# Patient Record
Sex: Female | Born: 2000 | Race: White | Hispanic: No | Marital: Single | Smoking: Never smoker
Health system: Southern US, Community
[De-identification: ages and names within clinical notes are randomized; demographics above are authoritative.]

## PROBLEM LIST (undated history)

## (undated) DIAGNOSIS — R519 Headache, unspecified: Secondary | ICD-10-CM

## (undated) DIAGNOSIS — R51 Headache: Secondary | ICD-10-CM

## (undated) DIAGNOSIS — F909 Attention-deficit hyperactivity disorder, unspecified type: Secondary | ICD-10-CM

## (undated) DIAGNOSIS — R569 Unspecified convulsions: Secondary | ICD-10-CM

## (undated) DIAGNOSIS — E611 Iron deficiency: Secondary | ICD-10-CM

## (undated) HISTORY — DX: Headache, unspecified: R51.9

## (undated) HISTORY — DX: Unspecified convulsions: R56.9

## (undated) HISTORY — DX: Attention-deficit hyperactivity disorder, unspecified type: F90.9

## (undated) HISTORY — PX: NO PAST SURGERIES: SHX2092

## (undated) HISTORY — DX: Headache: R51

---
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
## 2020-03-20 ENCOUNTER — Other Ambulatory Visit: Payer: Self-pay

## 2020-03-20 ENCOUNTER — Telehealth (INDEPENDENT_AMBULATORY_CARE_PROVIDER_SITE_OTHER): Payer: Medicaid Other | Admitting: Psychiatry

## 2020-03-20 DIAGNOSIS — F329 Major depressive disorder, single episode, unspecified: Secondary | ICD-10-CM | POA: Diagnosis not present

## 2020-03-20 DIAGNOSIS — F410 Panic disorder [episodic paroxysmal anxiety] without agoraphobia: Secondary | ICD-10-CM | POA: Insufficient documentation

## 2020-03-20 DIAGNOSIS — F900 Attention-deficit hyperactivity disorder, predominantly inattentive type: Secondary | ICD-10-CM

## 2020-03-20 DIAGNOSIS — F32A Depression, unspecified: Secondary | ICD-10-CM | POA: Insufficient documentation

## 2020-03-20 MED ORDER — FLUOXETINE HCL 10 MG PO CAPS: ORAL_CAPSULE | ORAL | 0 refills | Status: DC

## 2020-03-20 MED ORDER — AMPHETAMINE-DEXTROAMPHET ER 15 MG PO CP24
15.0000 mg | ORAL_CAPSULE | ORAL | 0 refills | Status: DC
Start: 2020-03-20 — End: 2020-05-03

## 2020-03-20 MED ORDER — AMPHETAMINE-DEXTROAMPHET ER 15 MG PO CP24
15.0000 mg | ORAL_CAPSULE | ORAL | 0 refills | Status: DC
Start: 2020-04-20 — End: 2020-05-03

## 2020-03-20 NOTE — Progress Notes (Signed)
[facility] MD/PA/NP OP Progress Note  03/20/2020 10:42 AM Jamie Houston  MRN:  [id number] Interview was conducted by phone and I verified that I was speaking with the correct person using two identifiers. I discussed the limitations of evaluation and management by telemedicine and  the availability of in person appointments. Patient expressed understanding and agreed to proceed. Patient location - home; physician - home office.  Chief Complaint: Depression, anxiety, headaches.  HPI: 19 yo female with inattentive type of ADHD. Jamie Houston remembers being diagnosed with ADHD as a child and was on a "medciation for it" (does not remember the name) but her mother stopped giving it to her because of some problems with tolerability (again of unknown nature). She has no other psychiatric history - was never treated for depression, anxiety although admits to having occasional mild sx of both. We have started Adderall XR 15 mg and she noticed clear improvement in her ability to stay on task, concentrate. Initially she experienced mild jitteriness and stomach upset but these resolved within few days. Her appetite perhaps declined some. She takes it around 8 am and noticed that beneficial effect goes away around 4 pm. Her sleep did not change.We then increased dose to 20 mg and she reports tolerating it well and feels that it is " more effective" but is causing her daily headaches. She also started to experience panic attacks and depressed mood and is asking to add an antidepressant. She is not hopeless or suicidal.    Visit Diagnosis:    ICD-10-CM   1. Panic disorder  F41.0   2. Depressive disorder  F32.9   3. Attention deficit hyperactivity disorder (ADHD), predominantly inattentive type  F90.0     Past Psychiatric History: Please see intake H&P.  Past Medical History:  Past Medical History:  Diagnosis Date  . ADHD (attention deficit hyperactivity disorder)   . Headache   . Seizures ([facility])     Past Surgical  History:  Procedure Laterality Date  . NO PAST SURGERIES      Family Psychiatric History: Reviewed.  Family History:  Family History  Problem Relation Age of Onset  . Alcohol abuse Mother   . ADD / ADHD Cousin     Social History:  Social History   Socioeconomic History  . Marital status: Single    Spouse name: Not on file  . Number of children: Not on file  . Years of education: Not on file  . Highest education level: Not on file  Occupational History  . Not on file  Tobacco Use  . Smoking status: Passive Smoke Exposure - Never Smoker  . Smokeless tobacco: Never Used  Vaping Use  . Vaping Use: Never used  Substance and Sexual Activity  . Alcohol use: Never  . Drug use: Never  . Sexual activity: Not on file  Other Topics Concern  . Not on file  Social History Narrative   Elissa graduated[employer]; she does well in school. She lives with her grandparents, mother, step-father. She enjoys drawing, reading, and playing the trombone.    Social Determinants of Health   Financial Resource Strain:   . Difficulty of Paying Living Expenses: Not on file  Food Insecurity:   . Worried About Programme researcher, broadcasting/film/video in the Last Year: Not on file  . Ran Out of Food in the Last Year: Not on file  Transportation Needs:   . Lack of Transportation (Medical): Not on file  . Lack of Transportation (Non-Medical): Not  on file  Physical Activity:   . Days of Exercise per Week: Not on file  . Minutes of Exercise per Session: Not on file  Stress:   . Feeling of Stress : Not on file  Social Connections:   . Frequency of Communication with Friends and Family: Not on file  . Frequency of Social Gatherings with Friends and Family: Not on file  . Attends Religious Services: Not on file  . Active Member of Clubs or Organizations: Not on file  . Attends Banker Meetings: Not on file  . Marital Status: Not on file    Allergies: No Known Allergies  Metabolic Disorder  Labs: Lab Results  Component Value Date   HGBA1C 5.5 05/31/2019   No results found for: PROLACTIN No results found for: CHOL, TRIG, HDL, CHOLHDL, VLDL, LDLCALC Lab Results  Component Value Date   TSH 1.260 05/31/2019    Therapeutic Level Labs: No results found for: LITHIUM No results found for: VALPROATE No components found for:  CBMZ  Current Medications: Current Outpatient Medications  Medication Sig Dispense Refill  . albuterol (PROVENTIL HFA;VENTOLIN HFA) 108 (90 Base) MCG/ACT inhaler Inhale into the lungs.    Marland Kitchen amphetamine-dextroamphetamine (ADDERALL XR) 15 MG 24 hr capsule Take 1 capsule by mouth every morning. 30 capsule 0  . [START ON 04/20/2020] amphetamine-dextroamphetamine (ADDERALL XR) 15 MG 24 hr capsule Take 1 capsule by mouth every morning. 30 capsule 0  . FLUoxetine (PROZAC) 10 MG capsule Take 1 capsule (10 mg total) by mouth daily for 7 days, THEN 2 capsules (20 mg total) daily. 127 capsule 0   No current facility-administered medications for this visit.      Psychiatric Specialty Exam: Review of Systems  Psychiatric/Behavioral: Positive for decreased concentration. The patient is nervous/anxious.   All other systems reviewed and are negative.   There were no vitals taken for this visit.There is no height or weight on file to calculate BMI.  General Appearance: NA  Eye Contact:  NA  Speech:  Clear and Coherent and Normal Rate  Volume:  Normal  Mood:  Anxious and Depressed  Affect:  NA  Thought Process:  Goal Directed  Orientation:  Full (Time, Place, and Person)  Thought Content: Logical   Suicidal Thoughts:  No  Homicidal Thoughts:  No  Memory:  Immediate;   Good Recent;   Good Remote;   Good  Judgement:  Good  Insight:  Good  Psychomotor Activity:  Negative  Concentration:  Concentration: Fair  Recall:  Good  Fund of Knowledge: Good  Language: Good  Akathisia:  Negative  Handed:  Right  AIMS (if indicated): not done  Assets:   Communication Skills Desire for Improvement Housing Social Support  ADL's:  Intact  Cognition: WNL  Sleep:  Good   Screenings: Mini-Mental     Office Visit from 05/31/2019 in [facility]  Total Score (max 30 points ) 28       Assessment and Plan: 19 yo female with inattentive type of ADHD. Zeda remembers being diagnosed with ADHD as a child and was on a "medciation for it" (does not remember the name) but her mother stopped giving it to her because of some problems with tolerability (again of unknown nature). She has no other psychiatric history - was never treated for depression, anxiety although admits to having occasional mild sx of both. We have started Adderall XR 15 mg and she noticed clear improvement in her ability to stay on task, concentrate.  Initially she experienced mild jitteriness and stomach upset but these resolved within few days. Her appetite perhaps declined some. She takes it around 8 am and noticed that beneficial effect goes away around 4 pm. Her sleep did not change.We then increased dose to 20 mg and she reports tolerating it well and feels that it is " more effective" but is causing her daily headaches. She also started to experience panic attacks and depressed mood and is asking to add an antidepressant. She is not hopeless or suicidal.   Dx: Depression, unspecified; Panid disorder; ADHD predominantly inattentive type  Plan: WewillcontinueAdderallXRbut 15 mg daily and add fluoxetine 10 mg x 7 days then 20 mg daily. Next appointment in 23 months. The plan was discussed with patient who had an opportunity to ask questions and these were all answered. I spend8minutes in phone consultation with the patient.    Magdalene Patricia, MD 03/20/2020, 10:42 AM

## 2020-05-03 ENCOUNTER — Other Ambulatory Visit (HOSPITAL_COMMUNITY): Payer: Self-pay | Admitting: Psychiatry

## 2020-05-03 ENCOUNTER — Telehealth (INDEPENDENT_AMBULATORY_CARE_PROVIDER_SITE_OTHER): Payer: Medicaid Other | Admitting: Psychiatry

## 2020-05-03 ENCOUNTER — Other Ambulatory Visit: Payer: Self-pay

## 2020-05-03 DIAGNOSIS — F900 Attention-deficit hyperactivity disorder, predominantly inattentive type: Secondary | ICD-10-CM | POA: Diagnosis not present

## 2020-05-03 DIAGNOSIS — F410 Panic disorder [episodic paroxysmal anxiety] without agoraphobia: Secondary | ICD-10-CM

## 2020-05-03 DIAGNOSIS — F329 Major depressive disorder, single episode, unspecified: Secondary | ICD-10-CM | POA: Diagnosis not present

## 2020-05-03 DIAGNOSIS — F32A Depression, unspecified: Secondary | ICD-10-CM

## 2020-05-03 MED ORDER — AMPHETAMINE-DEXTROAMPHET ER 15 MG PO CP24
15.0000 mg | ORAL_CAPSULE | ORAL | 0 refills | Status: DC
Start: 2020-05-21 — End: 2020-06-13

## 2020-05-03 MED ORDER — AMPHETAMINE-DEXTROAMPHET ER 15 MG PO CP24
15.0000 mg | ORAL_CAPSULE | ORAL | 0 refills | Status: DC
Start: 2020-07-21 — End: 2020-06-13

## 2020-05-03 MED ORDER — AMPHETAMINE-DEXTROAMPHET ER 15 MG PO CP24
15.0000 mg | ORAL_CAPSULE | ORAL | 0 refills | Status: DC
Start: 2020-06-20 — End: 2020-06-13

## 2020-05-03 MED ORDER — FLUOXETINE HCL 20 MG PO CAPS: 20.0000 mg | ORAL_CAPSULE | Freq: Every day | ORAL | 1 refills | Status: DC

## 2020-05-03 NOTE — Progress Notes (Signed)
BH MD/PA/NP OP Progress Note  05/03/2020 1:12 PM Jamie Houston  MRN:  [id number] Interview was conducted by phone and I verified that I was speaking with the correct person using two identifiers. I discussed the limitations of evaluation and management by telemedicine and  the availability of in person appointments. Patient expressed understanding and agreed to proceed. Patient location - home; physician - home office.  Chief Complaint: "I feel better".  HPI: 19 yo femalewith inattentive type of ADHD.Jamie Houston remembers being diagnosed with ADHD as a child and was on a "medciation for it" (does not remember the name) but her mother stopped giving it to her because of some problems with tolerability (again of unknown nature). She has no other psychiatric history - was never treated for depression, anxiety although admits to having occasional mild sx of both. We have started Adderall XR 15 mg and she noticed clear improvement in her ability to stay on task, concentrate. Initially she experienced mild jitteriness and stomach upset but these resolved within few days. Her appetite perhaps declined some. She takes it around 8 am and noticed that beneficial effect goes away around 4 pm.We then increased dose to 20 mg and she reports tolerating it well and feels that it is " more effective" but is causing her daily headaches. She also started to experience panic attacks and depressed mood and asked to add an antidepressant. We started fluoxetine and at 20 mg dose her mood has normalized. Her appetite and sleep are good. She is not hopeless or suicidal. She lives with family, does not work, mostly spends her time playing video games.    Visit Diagnosis:    ICD-10-CM   1. Panic disorder  F41.0   2. Attention deficit hyperactivity disorder (ADHD), predominantly inattentive type  F90.0   3. Depressive disorder  F32.9     Past Psychiatric History: Please see intake H&P.  Past Medical History:  Past  Medical History:  Diagnosis Date  . ADHD (attention deficit hyperactivity disorder)   . Headache   . Seizures (HCC)     Past Surgical History:  Procedure Laterality Date  . NO PAST SURGERIES      Family Psychiatric History: Reviewed.  Family History:  Family History  Problem Relation Age of Onset  . Alcohol abuse Mother   . ADD / ADHD Cousin     Social History:  Social History   Socioeconomic History  . Marital status: Single    Spouse name: Not on file  . Number of children: Not on file  . Years of education: Not on file  . Highest education level: Not on file  Occupational History  . Not on file  Tobacco Use  . Smoking status: Passive Smoke Exposure - Never Smoker  . Smokeless tobacco: Never Used  Vaping Use  . Vaping Use: Never used  Substance and Sexual Activity  . Alcohol use: Never  . Drug use: Never  . Sexual activity: Not on file  Other Topics Concern  . Not on file  Social History Narrative   Jamie Houston graduated[employer]; she does well in school. She lives with her grandparents, mother, step-father. She enjoys drawing, reading, and playing the trombone.    Social Determinants of Health   Financial Resource Strain:   . Difficulty of Paying Living Expenses: Not on file  Food Insecurity:   . Worried About Programme researcher, broadcasting/film/video in the Last Year: Not on file  . Ran Out of Food in the Last  Year: Not on file  Transportation Needs:   . Lack of Transportation (Medical): Not on file  . Lack of Transportation (Non-Medical): Not on file  Physical Activity:   . Days of Exercise per Week: Not on file  . Minutes of Exercise per Session: Not on file  Stress:   . Feeling of Stress : Not on file  Social Connections:   . Frequency of Communication with Friends and Family: Not on file  . Frequency of Social Gatherings with Friends and Family: Not on file  . Attends Religious Services: Not on file  . Active Member of Clubs or Organizations: Not on file  . Attends  Banker Meetings: Not on file  . Marital Status: Not on file    Allergies: No Known Allergies  Metabolic Disorder Labs: Lab Results  Component Value Date   HGBA1C 5.5 05/31/2019   No results found for: PROLACTIN No results found for: CHOL, TRIG, HDL, CHOLHDL, VLDL, LDLCALC Lab Results  Component Value Date   TSH 1.260 05/31/2019    Therapeutic Level Labs: No results found for: LITHIUM No results found for: VALPROATE No components found for:  CBMZ  Current Medications: Current Outpatient Medications  Medication Sig Dispense Refill  . albuterol (PROVENTIL HFA;VENTOLIN HFA) 108 (90 Base) MCG/ACT inhaler Inhale into the lungs.    Melene Muller ON 06/20/2020] amphetamine-dextroamphetamine (ADDERALL XR) 15 MG 24 hr capsule Take 1 capsule by mouth every morning. 30 capsule 0  . [START ON 05/21/2020] amphetamine-dextroamphetamine (ADDERALL XR) 15 MG 24 hr capsule Take 1 capsule by mouth every morning. 30 capsule 0  . [START ON 07/21/2020] amphetamine-dextroamphetamine (ADDERALL XR) 15 MG 24 hr capsule Take 1 capsule by mouth every morning. 30 capsule 0  . FLUoxetine (PROZAC) 20 MG capsule Take 1 capsule (20 mg total) by mouth daily. 90 capsule 1   No current facility-administered medications for this visit.     Psychiatric Specialty Exam: Review of Systems  All other systems reviewed and are negative.   There were no vitals taken for this visit.There is no height or weight on file to calculate BMI.  General Appearance: NA  Eye Contact:  NA  Speech:  Clear and Coherent and Normal Rate  Volume:  Normal  Mood:  Euthymic  Affect:  NA  Thought Process:  Goal Directed  Orientation:  Full (Time, Place, and Person)  Thought Content: Logical   Suicidal Thoughts:  No  Homicidal Thoughts:  No  Memory:  Immediate;   Good Recent;   Good Remote;   Good  Judgement:  Good  Insight:  Fair  Psychomotor Activity:  NA  Concentration:  Concentration: Good  Recall:  Good  Fund of  Knowledge: Good  Language: Good  Akathisia:  Negative  Handed:  Right  AIMS (if indicated): not done  Assets:  Communication Skills Desire for Improvement Financial Resources/Insurance Housing Social Support  ADL's:  Intact  Cognition: WNL  Sleep:  Good   Screenings: Mini-Mental     Office Visit from 05/31/2019 in [facility]  Total Score (max 30 points ) 28       Assessment and Plan: 19 yo femalewith inattentive type of ADHD.Jamie Houston remembers being diagnosed with ADHD as a child and was on a "medciation for it" (does not remember the name) but her mother stopped giving it to her because of some problems with tolerability (again of unknown nature). She has no other psychiatric history - was never treated for depression, anxiety although admits to  having occasional mild sx of both. We have started Adderall XR 15 mg and she noticed clear improvement in her ability to stay on task, concentrate. Initially she experienced mild jitteriness and stomach upset but these resolved within few days. Her appetite perhaps declined some. She takes it around 8 am and noticed that beneficial effect goes away around 4 pm.We then increased dose to 20 mg and she reports tolerating it well and feels that it is " more effective" but is causing her daily headaches. She also started to experience panic attacks and depressed mood and asked to add an antidepressant. We started fluoxetine and at 20 mg dose her mood has normalized. Her appetite and sleep are good. She is not hopeless or suicidal. She lives with family, does not work, mostly spends her time playing video games.  Dx: ADHD predominantly inattentive type; Depression, unspecified; Panid disorder   Plan: WewillcontinueAdderallXRbut 15 mg daily and fluoxetine 20 mg daily. Next appointment in 3 months. The plan was discussed with patient who had an opportunity to ask questions and these were all answered. I spend38minutes  inphone consultationwith the patient.   Magdalene Patricia, MD 05/03/2020, 1:12 PM

## 2020-06-13 ENCOUNTER — Other Ambulatory Visit (HOSPITAL_COMMUNITY): Payer: Self-pay | Admitting: Psychiatry

## 2020-06-13 MED ORDER — AMPHETAMINE-DEXTROAMPHET ER 15 MG PO CP24
15.0000 mg | ORAL_CAPSULE | ORAL | 0 refills | Status: DC
Start: 2020-07-21 — End: 2020-08-02

## 2020-06-13 MED ORDER — AMPHETAMINE-DEXTROAMPHET ER 15 MG PO CP24
15.0000 mg | ORAL_CAPSULE | ORAL | 0 refills | Status: AC
Start: 2020-08-21 — End: 2020-09-20

## 2020-06-13 MED ORDER — FLUOXETINE HCL 20 MG PO CAPS: 20.0000 mg | ORAL_CAPSULE | Freq: Every day | ORAL | 1 refills | Status: AC

## 2020-06-13 MED ORDER — AMPHETAMINE-DEXTROAMPHET ER 15 MG PO CP24
15.0000 mg | ORAL_CAPSULE | ORAL | 0 refills | Status: DC
Start: 2020-06-20 — End: 2020-08-02

## 2020-08-02 ENCOUNTER — Other Ambulatory Visit: Payer: Self-pay

## 2020-08-02 ENCOUNTER — Telehealth (INDEPENDENT_AMBULATORY_CARE_PROVIDER_SITE_OTHER): Payer: Medicaid Other | Admitting: Psychiatry

## 2020-08-02 DIAGNOSIS — F900 Attention-deficit hyperactivity disorder, predominantly inattentive type: Secondary | ICD-10-CM

## 2020-08-02 DIAGNOSIS — F410 Panic disorder [episodic paroxysmal anxiety] without agoraphobia: Secondary | ICD-10-CM

## 2020-08-02 MED ORDER — AMPHETAMINE-DEXTROAMPHET ER 15 MG PO CP24: 15.0000 mg | ORAL_CAPSULE | ORAL | 0 refills | Status: AC

## 2020-08-02 NOTE — Progress Notes (Signed)
BH MD/PA/NP OP Progress Note  08/02/2020 1:07 PM Jamie Houston  MRN:  [id number] Interview was conducted by phone and I verified that I was speaking with the correct person using two identifiers. I discussed the limitations of evaluation and management by telemedicine and  the availability of in person appointments. Patient expressed understanding and agreed to proceed. Participants in the visit: patient (location - home); physician (location - home office).  Chief Complaint: None.  HPI: 20yo femalewith inattentive type of ADHD.Kaysia remembers being diagnosed with ADHD as a child and was on a "medciation for it" (does not remember the name) but her mother stopped giving it to her because of some problems with tolerability (again of unknown nature). She has no other psychiatric history - was never treated for depression, anxiety although admits to having occasional mild sx of both. We have started Adderall XR 15 mg and she noticed clear improvement in her ability to stay on task, concentrate. Initially she experienced mild jitteriness and stomach upset but these resolved  within few days. Her appetite perhaps declined some. She takes it around 8 am and noticed that beneficial effect goes away around 4 pm.We then increased dose to 20 mg but while she reported it being " more effective"it also caused daily headaches so we decreased dose back to 15 mg. She also started to experience panic attacks and depressed mood and asked to add an antidepressant. We started fluoxetine and at 20 mg dose her mood has normalized. Her appetite is fair and sleep is good. She is not hopeless or suicidal.She lives with family, mostly spends her time playing video games but will soon start working with her father.     Visit Diagnosis:    ICD-10-CM   1. Attention deficit hyperactivity disorder (ADHD), predominantly inattentive type  F90.0   2. Panic disorder  F41.0     Past Psychiatric History: Please see intake  H&P.  Past Medical History:  Past Medical History:  Diagnosis Date  . ADHD (attention deficit hyperactivity disorder)   . Headache   . Seizures (HCC)     Past Surgical History:  Procedure Laterality Date  . NO PAST SURGERIES      Family Psychiatric History: Reviewed.  Family History:  Family History  Problem Relation Age of Onset  . Alcohol abuse Mother   . ADD / ADHD Cousin     Social History:  Social History   Socioeconomic History  . Marital status: Single    Spouse name: Not on file  . Number of children: Not on file  . Years of education: Not on file  . Highest education level: Not on file  Occupational History  . Not on file  Tobacco Use  . Smoking status: Passive Smoke Exposure - Never Smoker  . Smokeless tobacco: Never Used  Vaping Use  . Vaping Use: Never used  Substance and Sexual Activity  . Alcohol use: Never  . Drug use: Never  . Sexual activity: Not on file  Other Topics Concern  . Not on file  Social History Narrative   Emylie graduated[employer]; she does well in school. She lives with her grandparents, mother, step-father. She enjoys drawing, reading, and playing the trombone.    Social Determinants of Health   Financial Resource Strain: Not on file  Food Insecurity: Not on file  Transportation Needs: Not on file  Physical Activity: Not on file  Stress: Not on file  Social Connections: Not on file  Allergies: No Known Allergies  Metabolic Disorder Labs: Lab Results  Component Value Date   HGBA1C 5.5 05/31/2019   No results found for: PROLACTIN No results found for: CHOL, TRIG, HDL, CHOLHDL, VLDL, LDLCALC Lab Results  Component Value Date   TSH 1.260 05/31/2019    Therapeutic Level Labs: No results found for: LITHIUM No results found for: VALPROATE No components found for:  CBMZ  Current Medications: Current Outpatient Medications  Medication Sig Dispense Refill  . albuterol (PROVENTIL HFA;VENTOLIN HFA) 108 (90 Base)  MCG/ACT inhaler Inhale into the lungs.    Melene Muller ON 08/21/2020] amphetamine-dextroamphetamine (ADDERALL XR) 15 MG 24 hr capsule Take 1 capsule by mouth every morning. 30 capsule 0  . [START ON 10/22/2020] amphetamine-dextroamphetamine (ADDERALL XR) 15 MG 24 hr capsule Take 1 capsule by mouth every morning. 30 capsule 0  . [START ON 09/21/2020] amphetamine-dextroamphetamine (ADDERALL XR) 15 MG 24 hr capsule Take 1 capsule by mouth every morning. 30 capsule 0  . FLUoxetine (PROZAC) 20 MG capsule Take 1 capsule (20 mg total) by mouth daily. 90 capsule 1   No current facility-administered medications for this visit.    Psychiatric Specialty Exam: Review of Systems  All other systems reviewed and are negative.   There were no vitals taken for this visit.There is no height or weight on file to calculate BMI.  General Appearance: NA  Eye Contact:  NA  Speech:  Clear and Coherent and Normal Rate  Volume:  Normal  Mood:  Euthymic  Affect:  NA  Thought Process:  Goal Directed  Orientation:  Full (Time, Place, and Person)  Thought Content: Logical   Suicidal Thoughts:  No  Homicidal Thoughts:  No  Memory:  Immediate;   Good Recent;   Fair Remote;   Fair  Judgement:  Good  Insight:  Fair  Psychomotor Activity:  NA  Concentration:  Concentration: Fair  Recall:  Good  Fund of Knowledge: Fair  Language: Good  Akathisia:  Negative  Handed:  Right  AIMS (if indicated): not done  Assets:  Communication Skills Desire for Improvement Financial Resources/Insurance Housing Social Support  ADL's:  Intact  Cognition: WNL  Sleep:  Good   Screenings: Mini-Mental   Flowsheet Row Office Visit from 05/31/2019 in [facility]  Total Score (max 30 points ) 28       Assessment and Plan:  20yo femalewith inattentive type of ADHD.Jamie Houston remembers being diagnosed with ADHD as a child and was on a "medciation for it" (does not remember the name) but her mother stopped giving  it to her because of some problems with tolerability (again of unknown nature). She has no other psychiatric history - was never treated for depression, anxiety although admits to having occasional mild sx of both. We have started Adderall XR 15 mg and she noticed clear improvement in her ability to stay on task, concentrate. Initially she experienced mild jitteriness and stomach upset but these resolved  within few days. Her appetite perhaps declined some. She takes it around 8 am and noticed that beneficial effect goes away around 4 pm.We then increased dose to 20 mg but while she reported it being " more effective"it also caused daily headaches so we decreased dose back to 15 mg. She also started to experience panic attacks and depressed mood and asked to add an antidepressant. We started fluoxetine and at 20 mg dose her mood has normalized. Her appetite is fair and sleep is good. She is not hopeless or suicidal.She  lives with family, mostly spends her time playing video games but will soon start working with her father.  MV:VKPQ predominantly inattentive type; Panic disorder  Plan: WewillcontinueAdderallXRbut 15 mgdaily and fluoxetine 20 mg daily. Next appointment in3 months with a new psychiatrist due to my retirement. The plan was discussed with patient who had an opportunity to ask questions and these were all answered. I spend36minutes inphone consultationwith the patient.    Magdalene Patricia, MD 08/02/2020, 1:07 PM

## 2021-01-26 ENCOUNTER — Other Ambulatory Visit: Payer: Self-pay

## 2021-01-26 ENCOUNTER — Ambulatory Visit (INDEPENDENT_AMBULATORY_CARE_PROVIDER_SITE_OTHER): Payer: Medicaid Other

## 2021-01-26 ENCOUNTER — Ambulatory Visit
Admission: RE | Admit: 2021-01-26 | Discharge: 2021-01-26 | Disposition: A | Discharge status: Home / Self Care | Payer: Medicaid Other | Source: Ambulatory Visit | Attending: Family Medicine | Admitting: Family Medicine

## 2021-01-26 VITALS — BP 138/87 | HR 73 | Temp 98.4°F | Resp 18

## 2021-01-26 DIAGNOSIS — S99922A Unspecified injury of left foot, initial encounter: Secondary | ICD-10-CM | POA: Diagnosis not present

## 2021-01-26 DIAGNOSIS — S96912A Strain of unspecified muscle and tendon at ankle and foot level, left foot, initial encounter: Secondary | ICD-10-CM

## 2021-01-26 DIAGNOSIS — W19XXXA Unspecified fall, initial encounter: Secondary | ICD-10-CM

## 2021-01-26 MED ORDER — NAPROXEN 375 MG PO TABS: 375.0000 mg | ORAL_TABLET | Freq: Two times a day (BID) | ORAL | 0 refills | Status: DC

## 2021-01-26 NOTE — ED Triage Notes (Signed)
Fell on Tuesday with left foot pain.  Pain has become worse over the past several days.

## 2021-01-26 NOTE — ED Provider Notes (Signed)
Renaldo Fiddler    CSN: [id number] Arrival date & time: 01/26/21  1402      History   Chief Complaint No chief complaint on file.   HPI Jamie Houston is a 20 y.o. female.   HPI Patient presents for evaluation of left foot injury following a fall a few days ago. Denies prior fracture. She has attempted relief with OTC medication without relief.  Past Medical History:  Diagnosis Date   ADHD (attention deficit hyperactivity disorder)    Headache    Seizures (HCC)     Patient Active Problem List   Diagnosis Date Noted   Panic disorder 03/20/2020   Depressive disorder 03/20/2020   Attention deficit hyperactivity disorder (ADHD), predominantly inattentive type 07/11/2019   Seizure-like activity (HCC) 03/30/2017   Alteration of awareness 03/30/2017   Frequent headaches 03/30/2017    Past Surgical History:  Procedure Laterality Date   NO PAST SURGERIES      OB History   No obstetric history on file.      Home Medications    Prior to Admission medications   Medication Sig Start Date End Date Taking? Authorizing Provider  buPROPion (WELLBUTRIN) 75 MG tablet Take 75 mg by mouth 2 (two) times daily.   Yes [provider]  naproxen (NAPROSYN) 375 MG tablet Take 1 tablet (375 mg total) by mouth 2 (two) times daily. 01/26/21  Yes Bing Neighbors, FNP  albuterol (PROVENTIL HFA;VENTOLIN HFA) 108 (90 Base) MCG/ACT inhaler Inhale into the lungs.    [provider]  amphetamine-dextroamphetamine (ADDERALL XR) 15 MG 24 hr capsule Take 1 capsule by mouth every morning. 08/21/20 09/20/20  Pucilowski, Roosvelt Maser, MD  amphetamine-dextroamphetamine (ADDERALL XR) 15 MG 24 hr capsule Take 1 capsule by mouth every morning. 10/22/20 11/21/20  Pucilowski, Roosvelt Maser, MD  amphetamine-dextroamphetamine (ADDERALL XR) 15 MG 24 hr capsule Take 1 capsule by mouth every morning. 09/21/20 10/21/20  Pucilowski, Roosvelt Maser, MD  FLUoxetine (PROZAC) 20 MG capsule Take 1 capsule (20 mg  total) by mouth daily. 06/13/20 12/10/20  Pucilowski, Roosvelt Maser, MD    Family History Family History  Problem Relation Age of Onset   Alcohol abuse Mother    ADD / ADHD Cousin     Social History Social History   Tobacco Use   Smoking status: Passive Smoke Exposure - Never Smoker   Smokeless tobacco: Never  Vaping Use   Vaping Use: Never used  Substance Use Topics   Alcohol use: Never   Drug use: Never     Allergies   Patient has no known allergies.   Review of Systems Review of Systems Pertinent negatives listed in HPI   Physical Exam Triage Vital Signs ED Triage Vitals  Enc Vitals Group     BP 01/26/21 1425 138/87     Pulse Rate 01/26/21 1425 73     Resp 01/26/21 1425 18     Temp 01/26/21 1425 98.4 F (36.9 C)     Temp Source 01/26/21 1425 Tympanic     SpO2 01/26/21 1425 99 %     Weight --      Height --      Head Circumference --      Peak Flow --      Pain Score 01/26/21 1432 5     Pain Loc --      Pain Edu? --      Excl. in GC? --    No data found.  Updated Vital Signs BP 138/87 (  BP Location: Right Arm)   Pulse 73   Temp 98.4 F (36.9 C) (Tympanic)   Resp 18   SpO2 99%   Visual Acuity Right Eye Distance:   Left Eye Distance:   Bilateral Distance:    Right Eye Near:   Left Eye Near:    Bilateral Near:     Physical Exam HENT:     Head: Normocephalic.  Cardiovascular:     Rate and Rhythm: Normal rate and regular rhythm.     Pulses:          Dorsalis pedis pulses are 2+ on the right side and 2+ on the left side.  Pulmonary:     Effort: Pulmonary effort is normal.  Feet:     Left foot:     Toenail Condition: Left toenails are normal.     Comments: No swelling and ecchymosis noted at ankle  Skin:    Capillary Refill: Capillary refill takes less than 2 seconds.  Neurological:     General: No focal deficit present.     Mental Status: She is alert.  Psychiatric:        Mood and Affect: Mood normal.        Behavior: Behavior normal.         Thought Content: Thought content normal.        Judgment: Judgment normal.    UC Treatments / Results  Labs (all labs ordered are listed, but only abnormal results are displayed) Labs Reviewed - No data to display  EKG   DG Foot Complete Left  Result Date: 01/26/2021 CLINICAL DATA:  Left foot injury from twisting during a fall. Fall 5 days ago. EXAM: LEFT FOOT - COMPLETE 3+ VIEW COMPARISON:  None. FINDINGS: There is no evidence of fracture or dislocation. There is no evidence of arthropathy or other focal bone abnormality. Soft tissues are unremarkable. Normal alignment. IMPRESSION: Negative. Electronically Signed   By: Richarda Overlie M.D.   On: 01/26/2021 15:02     Procedures Procedures (including critical care time)  Medications Ordered in UC Medications - No data to display  Initial Impression / Assessment and Plan / UC Course  I have reviewed the triage vital signs and the nursing notes.  Pertinent labs & imaging results that were available during my care of the patient were reviewed by me and considered in my medical decision making (see chart for details).    Left foot pain related to muscle strain. Negative imaging. Trial naprosyn BID x 7 days. Follow-up with orthopedics if symptoms worsen or do not improve. Final Clinical Impressions(s) / UC Diagnoses   Final diagnoses:  Muscle strain of left foot, initial encounter   Discharge Instructions   None    ED Prescriptions     Medication Sig Dispense Auth. Provider   naproxen (NAPROSYN) 375 MG tablet Take 1 tablet (375 mg total) by mouth 2 (two) times daily. 20 tablet Bing Neighbors, FNP      PDMP not reviewed this encounter.   Bing Neighbors, FNP 01/29/21 1719

## 2021-07-28 ENCOUNTER — Other Ambulatory Visit: Payer: Self-pay

## 2021-07-28 ENCOUNTER — Emergency Department (HOSPITAL_COMMUNITY)
Admission: EM | Admit: 2021-07-28 | Discharge: 2021-07-28 | Disposition: A | Discharge status: Home / Self Care | Payer: Medicaid Other | Attending: Emergency Medicine | Admitting: Emergency Medicine

## 2021-07-28 ENCOUNTER — Encounter (HOSPITAL_COMMUNITY): Payer: Self-pay

## 2021-07-28 DIAGNOSIS — R42 Dizziness and giddiness: Secondary | ICD-10-CM | POA: Insufficient documentation

## 2021-07-28 DIAGNOSIS — R519 Headache, unspecified: Secondary | ICD-10-CM | POA: Insufficient documentation

## 2021-07-28 LAB — CBC WITH DIFFERENTIAL/PLATELET
Abs Immature Granulocytes: 0.02 10*3/uL (ref 0.00–0.07)
Basophils Absolute: 0 10*3/uL (ref 0.0–0.1)
Basophils Relative: 0 %
Eosinophils Absolute: 0.2 10*3/uL (ref 0.0–0.5)
Eosinophils Relative: 2 %
HCT: 39 % (ref 36.0–46.0)
Hemoglobin: 12.4 g/dL (ref 12.0–15.0)
Immature Granulocytes: 0 %
Lymphocytes Relative: 24 %
Lymphs Abs: 2.3 10*3/uL (ref 0.7–4.0)
MCH: 25.7 pg — ABNORMAL LOW (ref 26.0–34.0)
MCHC: 31.8 g/dL (ref 30.0–36.0)
MCV: 80.7 fL (ref 80.0–100.0)
Monocytes Absolute: 0.5 10*3/uL (ref 0.1–1.0)
Monocytes Relative: 5 %
Neutro Abs: 6.4 10*3/uL (ref 1.7–7.7)
Neutrophils Relative %: 69 %
Platelets: 349 10*3/uL (ref 150–400)
RBC: 4.83 MIL/uL (ref 3.87–5.11)
RDW: 14.6 % (ref 11.5–15.5)
WBC: 9.4 10*3/uL (ref 4.0–10.5)
nRBC: 0 % (ref 0.0–0.2)

## 2021-07-28 LAB — BASIC METABOLIC PANEL
Anion gap: 8 (ref 5–15)
BUN: 17 mg/dL (ref 6–20)
CO2: 25 mmol/L (ref 22–32)
Calcium: 9.1 mg/dL (ref 8.9–10.3)
Chloride: 106 mmol/L (ref 98–111)
Creatinine, Ser: 0.71 mg/dL (ref 0.44–1.00)
GFR, Estimated: >60 mL/min (ref >60)
Glucose, Bld: 80 mg/dL (ref 70–99)
Potassium: 3.5 mmol/L (ref 3.5–5.1)
Sodium: 139 mmol/L (ref 135–145)

## 2021-07-28 MED ORDER — SODIUM CHLORIDE 0.9 % IV BOLUS
1000.0000 mL | Freq: Once | INTRAVENOUS | Status: AC
  Administered 2021-07-28: 1000 mL via INTRAVENOUS

## 2021-07-28 MED ORDER — DIPHENHYDRAMINE HCL 50 MG/ML IJ SOLN
25.0000 mg | Freq: Once | INTRAMUSCULAR | Status: AC
  Administered 2021-07-28: 25 mg via INTRAVENOUS
  Filled 2021-07-28: qty 1

## 2021-07-28 MED ORDER — ONDANSETRON HCL 4 MG PO TABS: 4.0000 mg | ORAL_TABLET | Freq: Four times a day (QID) | ORAL | 0 refills | Status: AC

## 2021-07-28 MED ORDER — METOCLOPRAMIDE HCL 5 MG/ML IJ SOLN
10.0000 mg | Freq: Once | INTRAMUSCULAR | Status: AC
  Administered 2021-07-28: 10 mg via INTRAVENOUS
  Filled 2021-07-28: qty 2

## 2021-07-28 MED ORDER — KETOROLAC TROMETHAMINE 30 MG/ML IJ SOLN
30.0000 mg | Freq: Once | INTRAMUSCULAR | Status: AC
  Administered 2021-07-28: 30 mg via INTRAVENOUS
  Filled 2021-07-28: qty 1

## 2021-07-28 MED ORDER — NAPROXEN 500 MG PO TABS: 500.0000 mg | ORAL_TABLET | Freq: Two times a day (BID) | ORAL | 0 refills | Status: AC

## 2021-07-28 NOTE — ED Triage Notes (Addendum)
Pt arrived via EMS with complaints of headache, right eye pain, and dizziness. Pt states hx of anemia. Pt states she stopped her depo shot and adderall in november and has had increased HA since then. EMS states upon assessment pt's pupils were 67mm and unresponsive. Pt denies taking anything.

## 2021-07-28 NOTE — ED Provider Notes (Signed)
[facility] EMERGENCY DEPARTMENT Provider Note   CSN: [id number] Arrival date & time: 07/28/21  1836     History  Chief Complaint  Patient presents with   Dizziness    Jamie Houston is a 21 y.o. female.   Dizziness  This patient is a 21 year old female, she takes several medications including Adderall Wellbutrin and Prozac.  She currently works in Geologist, engineering at [employer] where tonight she was working, feeling ongoing headache which has been intermittent over several months multiple times per week, the headache is nonfocal, occasionally causes her pain behind either her right eye or her left eye and some associated nausea.  When this got on tonight which seem to be a little bit more intense that she also had a feeling of dizziness and felt like she was going to fall over, that feeling only comes on with movement and goes away when she holds perfectly still such as this time when she is in the stretcher.  Paramedics reported that the patient has a history of anemia, she takes that she takes iron supplements for that.  She also reports that she has been on the Depo shot in the past but because of an allergic reaction she did not get it renewed in December.  She has also been taking Adderall since November and has had increasing headache since that time.  The patient denies any drug use alcohol or tobacco, denies being pregnant but has had some vaginal bleeding for the last couple of months, she states that she is currently bleeding.  She denies shortness of breath fevers chills or coughing.  She does not have any diarrhea or blood in the stools and does not have any swelling in the legs.  She usually takes ibuprofen for headache, last had some this morning.  She has had a couple of meals today.  Home Medications Prior to Admission medications   Medication Sig Start Date End Date Taking? Authorizing Provider  naproxen (NAPROSYN) 500 MG tablet Take 1 tablet (500 mg total) by mouth 2  (two) times daily with a meal. 07/28/21  Yes Noemi Chapel, MD  ondansetron (ZOFRAN) 4 MG tablet Take 1 tablet (4 mg total) by mouth every 6 (six) hours. 07/28/21  Yes Noemi Chapel, MD  albuterol (PROVENTIL HFA;VENTOLIN HFA) 108 (90 Base) MCG/ACT inhaler Inhale into the lungs.    [provider]  amphetamine-dextroamphetamine (ADDERALL XR) 15 MG 24 hr capsule Take 1 capsule by mouth every morning. 08/21/20 09/20/20  Pucilowski, Marchia Bond, MD  amphetamine-dextroamphetamine (ADDERALL XR) 15 MG 24 hr capsule Take 1 capsule by mouth every morning. 10/22/20 11/21/20  Pucilowski, Marchia Bond, MD  amphetamine-dextroamphetamine (ADDERALL XR) 15 MG 24 hr capsule Take 1 capsule by mouth every morning. 09/21/20 10/21/20  Pucilowski, Marchia Bond, MD  buPROPion (WELLBUTRIN) 75 MG tablet Take 75 mg by mouth 2 (two) times daily.    [provider]  FLUoxetine (PROZAC) 20 MG capsule Take 1 capsule (20 mg total) by mouth daily. 06/13/20 12/10/20  Pucilowski, Marchia Bond, MD      Allergies    Patient has no known allergies.    Review of Systems   Review of Systems  Neurological:  Positive for dizziness.  All other systems reviewed and are negative.  Physical Exam Updated Vital Signs BP (!) 139/96 (BP Location: Left Arm)    Pulse (!) 108    Resp 16    Ht 1.6 m (5\' 3" )    Wt 72 kg  SpO2 100%    BMI 28.12 kg/m  Physical Exam Vitals and nursing note reviewed.  Constitutional:      General: She is not in acute distress.    Appearance: She is well-developed.  HENT:     Head: Normocephalic and atraumatic.     Mouth/Throat:     Pharynx: No oropharyngeal exudate.  Eyes:     General: No scleral icterus.       Right eye: No discharge.        Left eye: No discharge.     Conjunctiva/sclera: Conjunctivae normal.     Pupils: Pupils are equal, round, and reactive to light.  Neck:     Thyroid: No thyromegaly.     Vascular: No JVD.  Cardiovascular:     Rate and Rhythm: Normal rate and regular rhythm.      Heart sounds: Normal heart sounds. No murmur heard.   No friction rub. No gallop.  Pulmonary:     Effort: Pulmonary effort is normal. No respiratory distress.     Breath sounds: Normal breath sounds. No wheezing or rales.  Abdominal:     General: Bowel sounds are normal. There is no distension.     Palpations: Abdomen is soft. There is no mass.     Tenderness: There is no abdominal tenderness.  Musculoskeletal:        General: No tenderness. Normal range of motion.     Cervical back: Normal range of motion and neck supple.  Lymphadenopathy:     Cervical: No cervical adenopathy.  Skin:    General: Skin is warm and dry.     Findings: No erythema or rash.  Neurological:     Mental Status: She is alert.     Coordination: Coordination normal.     Comments: Normal strength and sensation in all 4 extremities, she is able to speak with normal speech without cranial nerve deficits 3 through 12.  No slurred speech, no difficulty with finger-nose-finger, level of alertness is totally normal  Psychiatric:        Behavior: Behavior normal.    ED Results / Procedures / Treatments   Labs (all labs ordered are listed, but only abnormal results are displayed) Labs Reviewed  CBC WITH DIFFERENTIAL/PLATELET - Abnormal; Notable for the following components:      Result Value   MCH 25.7 (*)    All other components within normal limits  BASIC METABOLIC PANEL  PREGNANCY, URINE  URINALYSIS, ROUTINE W REFLEX MICROSCOPIC    EKG None  Radiology No results found.  Procedures Procedures    Medications Ordered in ED Medications  sodium chloride 0.9 % bolus 1,000 mL (1,000 mLs Intravenous New Bag/Given 07/28/21 1945)  metoCLOPramide (REGLAN) injection 10 mg (10 mg Intravenous Given 07/28/21 1941)  ketorolac (TORADOL) 30 MG/ML injection 30 mg (30 mg Intravenous Given 07/28/21 1940)  diphenhydrAMINE (BENADRYL) injection 25 mg (25 mg Intravenous Given 07/28/21 1937)    ED Course/ Medical Decision  Making/ A&P                           Medical Decision Making Amount and/or Complexity of Data Reviewed Labs: ordered.  Risk Prescription drug management.   This patient presents to the ED for concern of headache with dizziness nausea, this involves an extensive number of treatment options, and is a complaint that carries with it a high risk of complications and morbidity.  The differential diagnosis includes migraine, cluster headaches, tension headaches,  the patient does state that it is somewhat related to if she is eating, how much she eats or what she eats as well as the presence of light.  At this time she does have mild photophobia   Co morbidities that complicate the patient evaluation  Recently stopped taking Depo and now has had some vaginal bleeding, would need to consider anemia   Additional history obtained:  Additional history obtained from electronic medical record External records from outside source obtained and reviewed including electronic medical record which shows that she has had multiple office visits, some for dermatitis, prolonged menstruation such as in November, ADHD and panic disorder as well   Lab Tests:  I Ordered, and personally interpreted labs.  The pertinent results include: CBC and metabolic panel, neither of which show any signs of anemia or electrolyte or renal dysfunction, urinalysis and pregnancy ordered but the patient refused stating that she wanted to go home prior to this being collected   Cardiac Monitoring:  The patient was maintained on a cardiac monitor.  I personally viewed and interpreted the cardiac monitored which showed an underlying rhythm of: Normal sinus   Medicines ordered and prescription drug management:  I ordered medication including Toradol, metoclopramide, Benadryl and IV fluid for headache and nausea Reevaluation of the patient after these medicines showed that the patient improved I have reviewed the patients home  medicines and have made adjustments as needed   Test Considered:  CT scan of the head however the patient improved significantly and did not need this   Critical Interventions:  Treatment of symptoms    Problem List / ED Course:  Headache, dizziness, patient did very well with medications and requested discharge, ambulated and tolerated p.o. prior to discharge   Reevaluation:  After the interventions noted above, I reevaluated the patient and found that they have :improved   Social Determinants of Health:  None   Dispostion:  After consideration of the diagnostic results and the patients response to treatment, I feel that the patent would benefit from discharge.          Final Clinical Impression(s) / ED Diagnoses Final diagnoses:  Dizziness  Acute nonintractable headache, unspecified headache type    Rx / DC Orders ED Discharge Orders          Ordered    ondansetron (ZOFRAN) 4 MG tablet  Every 6 hours        07/28/21 2052    naproxen (NAPROSYN) 500 MG tablet  2 times daily with meals        07/28/21 2052              Noemi Chapel, MD 07/28/21 2054

## 2021-07-28 NOTE — ED Notes (Signed)
Pt states that the medication made her headache worse and asked for water.

## 2021-07-28 NOTE — Discharge Instructions (Addendum)
Please take Naprosyn, 500mg  by mouth twice daily as needed for pain - this in an antiinflammatory medicine (NSAID) and is similar to ibuprofen - many people feel that it is stronger than ibuprofen and it is easier to take since it is a smaller pill.  Please use this only for 1 week - if your pain persists, you will need to follow up with your doctor in the office for ongoing guidance and pain control.     Zofran every 6 hours as needed for nasuea   Thank you for letting take care of you today!  Please obtain all of your results from medical records or have your doctors office obtain the results - share them with your doctor - you should be seen at your doctors office in the next 2 days. Call today to arrange your follow up. Take the medications as prescribed. Please review all of the medicines and only take them if you do not have an allergy to them. Please be aware that if you are taking birth control pills, taking other prescriptions, ESPECIALLY ANTIBIOTICS may make the birth control ineffective - if this is the case, either do not engage in sexual activity or use alternative methods of birth control such as condoms until you have finished the medicine and your family doctor says it is OK to restart them. If you are on a blood thinner such as COUMADIN, be aware that any other medicine that you take may cause the coumadin to either work too much, or not enough - you should have your coumadin level rechecked in next 7 days if this is the case.  ?  It is also a possibility that you have an allergic reaction to any of the medicines that you have been prescribed - Everybody reacts differently to medications and while MOST people have no trouble with most medicines, you may have a reaction such as nausea, vomiting, rash, swelling, shortness of breath. If this is the case, please stop taking the medicine immediately and contact your physician.   If you were given a medication in the ED such as percocet,  vicodin, or morphine, be aware that these medicines are sedating and may change your ability to take care of yourself adequately for several hours after being given this medicines - you should not drive or take care of small children if you were given this medicine in the Emergency Department or if you have been prescribed these types of medicines. ?   You should return to the ER IMMEDIATELY if you develop severe or worsening symptoms.   Occasionally the medications that you take can cause your symptoms such as headache dizziness or nausea.  Please talk to your family doctor about your medication list to make sure that they do not need to change any of your medicines

## 2021-12-07 ENCOUNTER — Ambulatory Visit
Admission: EM | Admit: 2021-12-07 | Discharge: 2021-12-07 | Disposition: A | Discharge status: Home / Self Care | Payer: Medicaid Other | Attending: Family Medicine | Admitting: Family Medicine

## 2021-12-07 ENCOUNTER — Encounter: Payer: Self-pay | Admitting: Emergency Medicine

## 2021-12-07 DIAGNOSIS — R300 Dysuria: Secondary | ICD-10-CM | POA: Diagnosis not present

## 2021-12-07 DIAGNOSIS — R102 Pelvic and perineal pain: Secondary | ICD-10-CM | POA: Diagnosis not present

## 2021-12-07 HISTORY — DX: Iron deficiency: E61.1

## 2021-12-07 LAB — POCT URINALYSIS DIP (MANUAL ENTRY)
Bilirubin, UA: NEGATIVE
Glucose, UA: 100 mg/dL — AB
Ketones, POC UA: NEGATIVE mg/dL
Nitrite, UA: NEGATIVE
Protein Ur, POC: 30 mg/dL — AB
Spec Grav, UA: 1.02 (ref 1.010–1.025)
Urobilinogen, UA: 1 E.U./dL
pH, UA: 5.5 (ref 5.0–8.0)

## 2021-12-07 LAB — POCT FASTING CBG KUC MANUAL ENTRY: POCT Glucose (KUC): 89 mg/dL (ref 70–99)

## 2021-12-07 LAB — POCT URINE PREGNANCY: Preg Test, Ur: NEGATIVE

## 2021-12-07 MED ORDER — CEPHALEXIN 500 MG PO CAPS: 500.0000 mg | ORAL_CAPSULE | Freq: Two times a day (BID) | ORAL | 0 refills | Status: AC

## 2021-12-07 NOTE — ED Triage Notes (Signed)
Patient c/o urinary frequency x 1 day.   Patient endorses increased vaginal discharge of " clearish white" consistency.   Patient endorses nausea at times " from the pain".   Patient endorses mid lower ABD pains at times. Patient endorses " I had some blood on my toilet tissue after wiping".   Patient has taken " one pill of amoxicillin yesterday".

## 2021-12-07 NOTE — ED Provider Notes (Signed)
[facility]    CSN: [id number] Arrival date & time: 12/07/21  1149      History   Chief Complaint Chief Complaint  Patient presents with   Urinary Frequency    HPI Jamie Houston is a 21 y.o. female.   Presenting today with 1 day history of urinary frequency, vaginal discharge, suprapubic abdominal pain, nausea off-and-on.  Denies fever, chills, vomiting, diarrhea, constipation, vaginal lesions or rashes, vaginal itching.  History of frequent urinary tract infections when she was younger but none recently.  No new sexual partners or concern for STIs.  LMP 11/24/2021.  The only thing that she has tried so far has been she took 1 amoxicillin yesterday.   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      Home Medications    Prior to Admission medications   Medication Sig Start Date End Date Taking? Authorizing Provider  amphetamine-dextroamphetamine (ADDERALL XR) 15 MG 24 hr capsule Take 1 capsule by mouth every morning. 09/21/20 12/07/21 Yes Pucilowski, Olgierd A, MD  buPROPion (WELLBUTRIN) 75 MG tablet Take 75 mg by mouth 2 (two) times daily.   Yes [provider]  cephALEXin (KEFLEX) 500 MG capsule Take 1 capsule (500 mg total) by mouth 2 (two) times daily. 12/07/21  Yes Volney American, PA-C  FEROSUL 325 (65 Fe) MG tablet Take 325 mg by mouth daily. 08/09/21  Yes [provider]  FLUoxetine (PROZAC) 20 MG  capsule Take 1 capsule (20 mg total) by mouth daily. 06/13/20 12/07/21 Yes Pucilowski, Olgierd A, MD  albuterol (PROVENTIL HFA;VENTOLIN HFA) 108 (90 Base) MCG/ACT inhaler Inhale into the lungs.    [provider]  amphetamine-dextroamphetamine (ADDERALL XR) 15 MG 24 hr capsule Take 1 capsule by mouth every morning. 08/21/20 09/20/20  Pucilowski, Marchia Bond, MD  amphetamine-dextroamphetamine (ADDERALL XR) 15 MG 24 hr capsule Take 1 capsule by mouth every morning. 10/22/20 11/21/20  Pucilowski, Marchia Bond, MD  naproxen (NAPROSYN) 500 MG tablet Take 1 tablet (500 mg total) by mouth 2 (two) times daily with a meal. 07/28/21   Noemi Chapel, MD  ondansetron (ZOFRAN) 4 MG tablet Take 1 tablet (4 mg total) by mouth every 6 (six) hours. 07/28/21   Noemi Chapel, MD    Family History Family History  Problem Relation Age of Onset   Alcohol abuse Mother    ADD / ADHD Cousin     Social History Social History   Tobacco Use   Smoking status: Never    Passive exposure: Yes   Smokeless tobacco: Never  Vaping Use   Vaping Use: Never used  Substance Use Topics   Alcohol use: Never   Drug use: Never     Allergies   Patient has no known allergies.   Review of Systems Review of Systems Per HPI  Physical Exam Triage Vital Signs ED Triage Vitals  Enc Vitals Group     BP 12/07/21  1225 138/90     Pulse Rate 12/07/21 1225 88     Resp 12/07/21 1225 18     Temp 12/07/21 1225 98.8 F (37.1 C)     Temp Source 12/07/21 1225 Oral     SpO2 12/07/21 1225 98 %     Weight --      Height --      Head Circumference --      Peak Flow --      Pain Score 12/07/21 1230 7     Pain Loc --      Pain Edu? --      Excl. in [place]? --    No data found.  Updated Vital Signs BP 138/90 (BP Location: Left Arm)   Pulse 88   Temp 98.8 F (37.1 C) (Oral)   Resp 18   LMP 11/24/2021 (Exact Date)   SpO2 98%   Visual Acuity Right Eye Distance:   Left Eye Distance:   Bilateral Distance:    Right Eye Near:    Left Eye Near:    Bilateral Near:     Physical Exam Vitals and nursing note reviewed.  Constitutional:      Appearance: Normal appearance. She is not ill-appearing.  HENT:     Head: Atraumatic.     Mouth/Throat:     Mouth: Mucous membranes are moist.     Pharynx: Oropharynx is clear.  Eyes:     Extraocular Movements: Extraocular movements intact.     Conjunctiva/sclera: Conjunctivae normal.  Cardiovascular:     Rate and Rhythm: Normal rate and regular rhythm.     Heart sounds: Normal heart sounds.  Pulmonary:     Effort: Pulmonary effort is normal.     Breath sounds: Normal breath sounds.  Abdominal:     General: Bowel sounds are normal. There is no distension.     Palpations: Abdomen is soft.     Tenderness: There is no abdominal tenderness. There is no right CVA tenderness, left CVA tenderness or guarding.  Genitourinary:    Comments: GU exam deferred, self swab performed Musculoskeletal:        General: Normal range of motion.     Cervical back: Normal range of motion and neck supple.  Skin:    General: Skin is warm and dry.  Neurological:     Mental Status: She is alert and oriented to person, place, and time.  Psychiatric:        Mood and Affect: Mood normal.        Thought Content: Thought content normal.        Judgment: Judgment normal.     UC Treatments / Results  Labs (all labs ordered are listed, but only abnormal results are displayed) Labs Reviewed  POCT URINALYSIS DIP (MANUAL ENTRY) - Abnormal; Notable for the following components:      Result Value   Glucose, UA =100 (*)    Blood, UA small (*)    Protein Ur, POC =30 (*)    Leukocytes, UA Trace (*)    All other components within normal limits  URINE CULTURE  POCT URINE PREGNANCY  POCT FASTING CBG [facility] MANUAL ENTRY  CERVICOVAGINAL ANCILLARY ONLY    EKG   Radiology No results found.  Procedures Procedures (including critical care time)  Medications Ordered in UC Medications - No data to  display  Initial Impression / Assessment and Plan / UC Course  I have reviewed the triage vital signs and the nursing notes.  Pertinent  labs & imaging results that were available during my care of the patient were reviewed by me and considered in my medical decision making (see chart for details).     Urinalysis with trace leuks, small amount of glucose so fingerstick glucose was checked which was within normal limits.  Discussed with patient that is difficult to discern if she has urinary tract infection as the amoxicillin she took yesterday could have skewed the results of the urinalysis.  We will send out for urine culture while awaiting vaginal swab.  Treat with Keflex given her history of urinary tract infections with similar symptoms while awaiting these results.  Adjust as needed.  Push fluids.  Return for worsening symptoms.  Final Clinical Impressions(s) / UC Diagnoses   Final diagnoses:  Dysuria  Suprapubic pain   Discharge Instructions   None    ED Prescriptions     Medication Sig Dispense Auth. Provider   cephALEXin (KEFLEX) 500 MG capsule Take 1 capsule (500 mg total) by mouth 2 (two) times daily. 10 capsule Volney American, [place]      PDMP not reviewed this encounter.   Volney American, [place] 12/07/21 1339

## 2021-12-08 LAB — URINE CULTURE: Culture: <10000 — AB

## 2021-12-09 LAB — CERVICOVAGINAL ANCILLARY ONLY
Bacterial Vaginitis (gardnerella): NEGATIVE
Candida Glabrata: NEGATIVE
Candida Vaginitis: NEGATIVE
Chlamydia: NEGATIVE
Comment: NEGATIVE
Comment: NEGATIVE
Comment: NEGATIVE
Comment: NEGATIVE
Comment: NEGATIVE
Comment: NORMAL
Neisseria Gonorrhea: NEGATIVE
Trichomonas: NEGATIVE

## 2023-02-15 IMAGING — DX DG FOOT COMPLETE 3+V*L*
3 series · 3 of 3 positions shown · non-contrast
Comparison: None.

CLINICAL DATA: Left foot injury from twisting during a fall. Fall 5
days ago.

EXAM:
LEFT FOOT - COMPLETE 3+ VIEW

[foot ap]
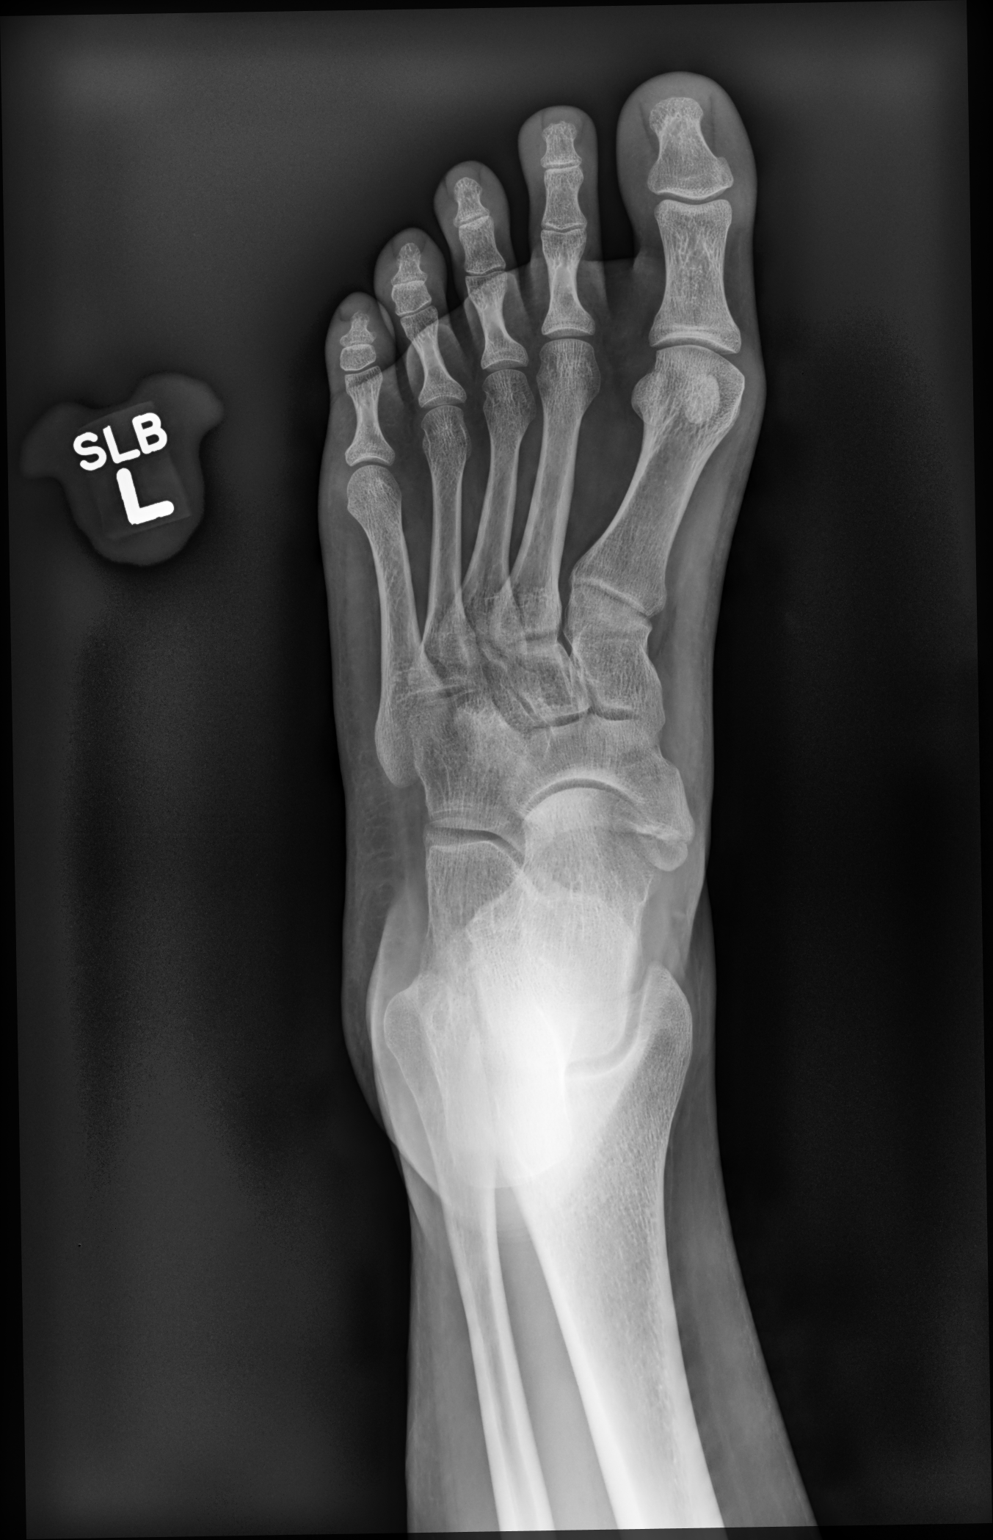

[foot mlo]
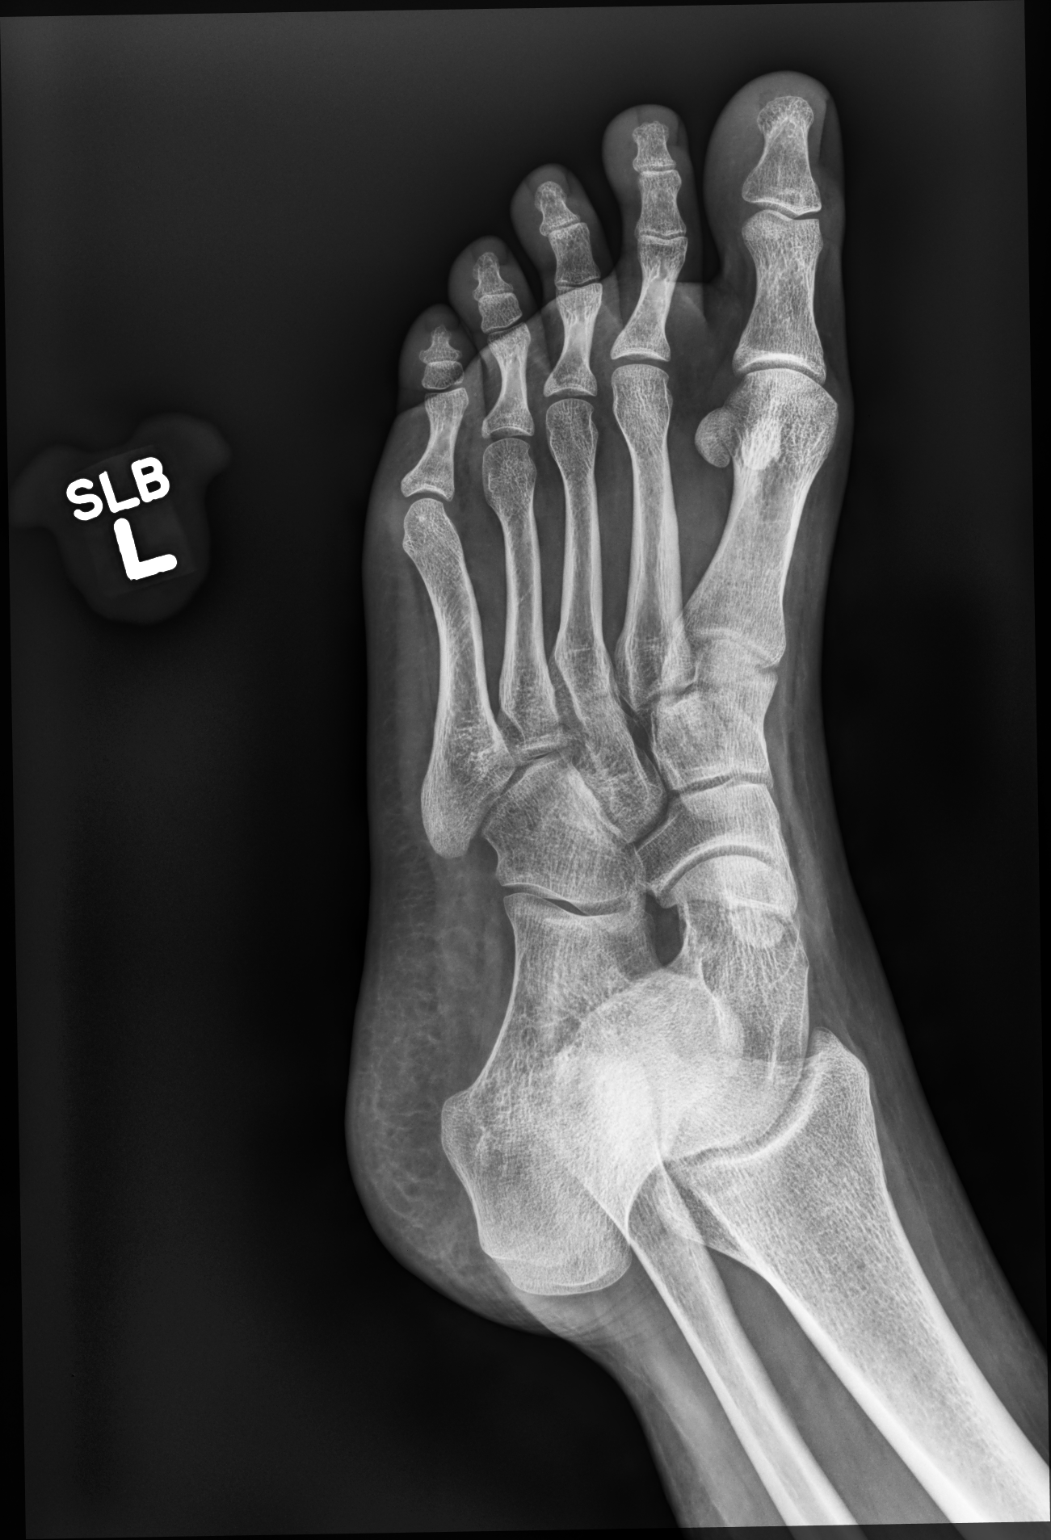

[foot lat]
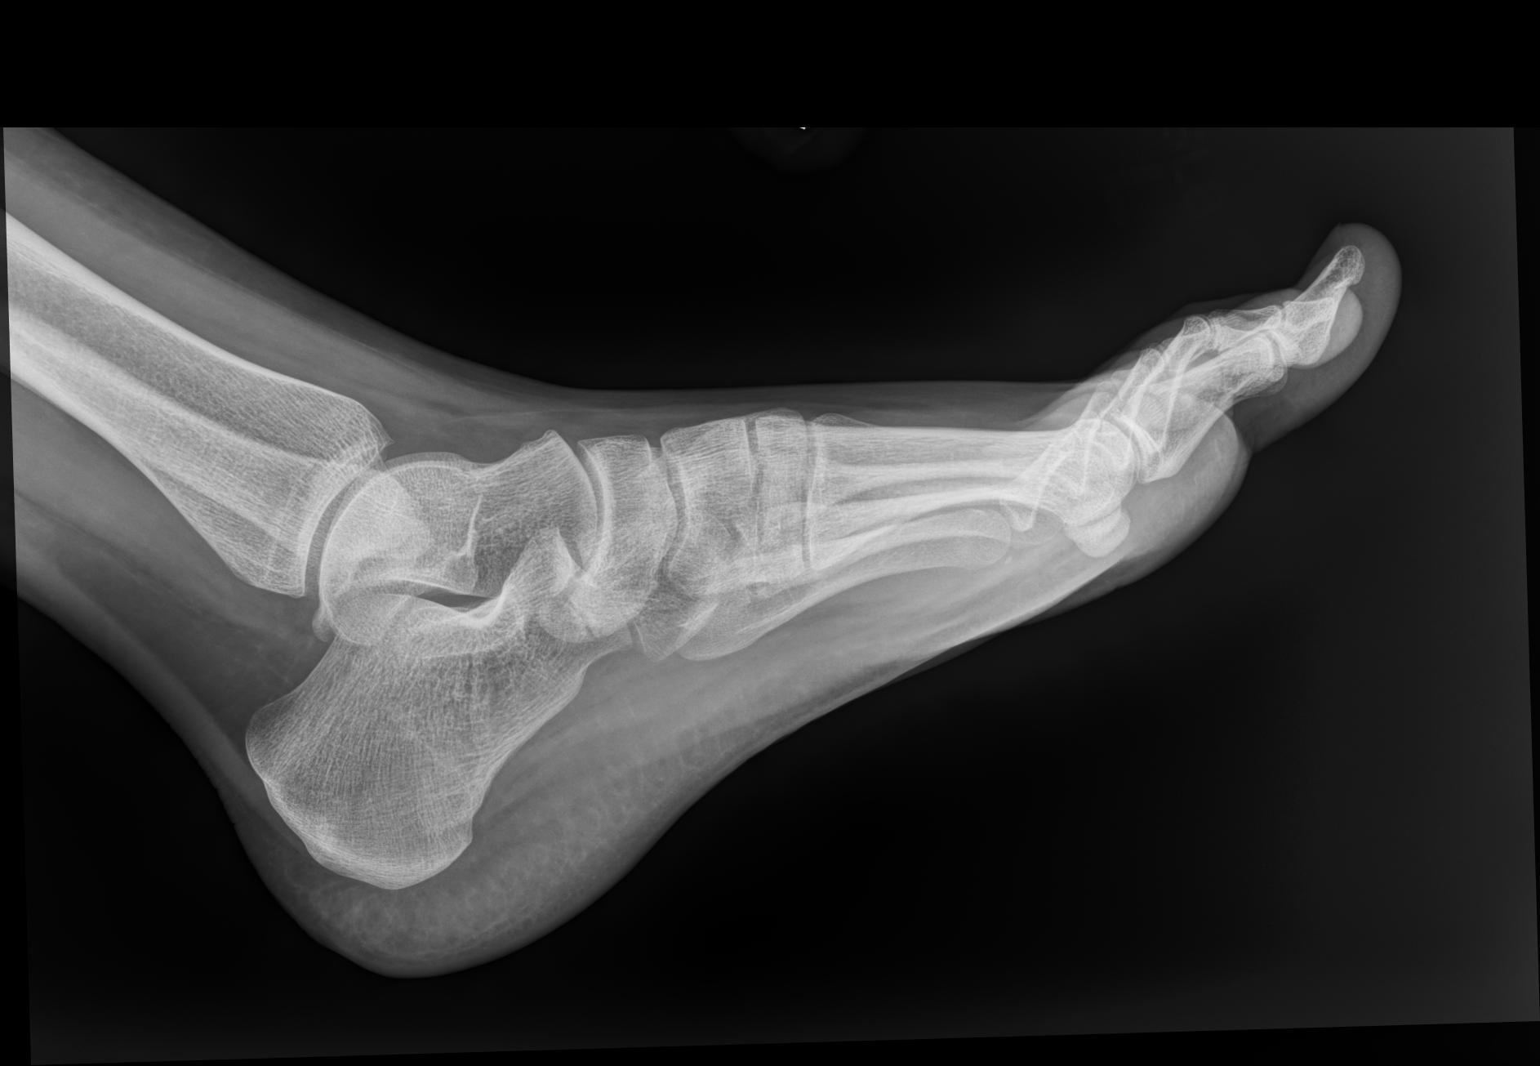

[3 of 3 positions shown; findings below may reference images not displayed]

FINDINGS: There is no evidence of fracture or dislocation. There is no
evidence of arthropathy or other focal bone abnormality. Soft
tissues are unremarkable. Normal alignment.
IMPRESSION: Negative.
